# Patient Record
Sex: Female | Born: 2000
Health system: Southern US, Community
[De-identification: ages and names within clinical notes are randomized; demographics above are authoritative.]

## PROBLEM LIST (undated history)

## (undated) DIAGNOSIS — R569 Unspecified convulsions: Secondary | ICD-10-CM

---
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
## 2022-07-24 ENCOUNTER — Telehealth: Payer: Self-pay

## 2022-07-24 NOTE — Telephone Encounter (Signed)
Call to patient after message stating she requests an emergent visit due to not being able to tolerate seizure medication (depakote). Patient has not had an office since 01/2021 and states she has not seen anyone else and could not tolerate the seizure medication that was prescribed so she stopped taking it. She states she hasn't had a seizure. I advised patient I would send message to Dr. Teresa Coombs however we were closed on Friday and due to time of day, she may not get a response until Monday. Advised if she had a seizure or changes and needed immediate care to go to urgent care or emergency room. Patient verbalized understanding.

## 2022-07-25 NOTE — Telephone Encounter (Signed)
Another option is that she can be seen by NP if I do not have any availability

## 2022-07-28 NOTE — Progress Notes (Unsigned)
Patient: Linda Erickson Date of Birth: 10-Feb-2001  Reason for Visit: Follow up History from: Patient, mother on her phone  Primary Neurologist: [place]  ASSESSMENT AND PLAN 22 y.o. year old female   1.  Juvenile myoclonic epilepsy  2.  Migraine headaches  -Start Zonegran 100 mg at bedtime x 1 week then increase to 200 mg at bedtime for seizure prevention -Previously tried and failed: Keppra (anger), Depakote (nausea) -Next steps: May consider Briviact, or Lamictal recommended  -Try Maxalt 10 mg as needed for acute migraine headache -Call for intolerance to medication for continued seizure events -Follow-up in 6 months with Dr. Teresa Coombs or sooner if needed  Meds ordered this encounter  Medications   zonisamide (ZONEGRAN) 100 MG capsule    Sig: Take 1 capsule (100 mg total) by mouth at bedtime for 7 days, THEN 2 capsules (200 mg total) at bedtime thereafter    Dispense:  60 capsule    Refill:  11   rizatriptan (MAXALT-MLT) 10 MG disintegrating tablet    Sig: Take 1 tablet (10 mg total) by mouth as needed for migraine. May repeat in 2 hours if needed    Dispense:  9 tablet    Refill:  11    HISTORY OF PRESENT ILLNESS: Today 07/29/22 When last seen in November 2022 Keppra was discontinued due to side effects of irritability and anger.  Started on Depakote ER 1000 mg daily, when down to the 250 mg pill to help her swallow.  EEG was normal.  MRI of the brain with and without contrast was normal. Reports she stopped the Depakote due to nausea, feeling weak, took no more than 1 month. Has not been on any medication in over 1 year. No seizures. Still has the single jerks, could be her whole body, drops things. Happens everyday. Last April, her mother recalls, holding plate of food, went across the room, her whole body jerked, confused afterwards very briefly. 2 weeks ago had a headache, prompted her to call. Girlfriend reports in her sleep she jerks. Describes headaches, locations moves,  "it hurts", sometimes wants to vomit. Sensitive to light. Takes Advil, helps briefly. Bad headaches are once a month. Mother has migraines. She is working at [employer], loading a truck from ground level. She drives a car.   HISTORY  01/24/21 Dr. Teresa Coombs: This is a 22 year old woman with no past medical history who is presenting for evaluation after seizure.  She is accompanied today by girlfriend and mother.  Per patient, she was traveling with her girlfriend to go to the movies, she remembered talking to her girlfriend in the car and next thing she knows, she is in the hospital, she states she was a little bit confused initially and did not know why she was in the hospital. Per girlfriend, they were in the car talking, then patient made a grunting noise, flail both arms then started having convulsion, head deviated to the left and foaming and blood coming from her mouth, eyes rolled back, entire episode lasted about 1 minute.  She called 911 but EMS did not arrive therefore she just drove her car to the emergency room.  In the ED she had a head CT which did not show any acute abnormality, she was loaded with Keppra and started on 500 mg of Keppra twice daily.  Patient reported history of seizures as an infant, she was started on Depakote, per mother she used for one year then seizures subsided.  Her last seizure was when she  was 22 years old.  Denies any other seizure risk factors   REVIEW OF SYSTEMS: Out of a complete 14 system review of symptoms, the patient complains only of the following symptoms, and all other reviewed systems are negative.  See HPI  ALLERGIES: Allergies  Allergen Reactions   Depakote [Divalproex Sodium] Nausea Only    HOME MEDICATIONS: Outpatient Medications Prior to Visit  Medication Sig Dispense Refill   ibuprofen (ADVIL) 200 MG tablet Take 200 mg by mouth every 6 (six) hours as needed.     divalproex (DEPAKOTE ER) 250 MG 24 hr tablet Take 4 tablets (1,000 mg total) by  mouth daily. 120 tablet 3   No facility-administered medications prior to visit.    PAST MEDICAL HISTORY: Past Medical History:  Diagnosis Date   Seizures (HCC)     PAST SURGICAL HISTORY: History reviewed. No pertinent surgical history.  FAMILY HISTORY: History reviewed. No pertinent family history.  SOCIAL HISTORY: Social History   Socioeconomic History   Marital status: Single    Spouse name: Not on file   Number of children: Not on file   Years of education: Not on file   Highest education level: Not on file  Occupational History   Not on file  Tobacco Use   Smoking status: Never   Smokeless tobacco: Never  Vaping Use   Vaping Use: Never used  Substance and Sexual Activity   Alcohol use: Never   Drug use: Never   Sexual activity: Not Currently  Other Topics Concern   Not on file  Social History Narrative   Not on file   Social Determinants of Health   Financial Resource Strain: Not on file  Food Insecurity: Not on file  Transportation Needs: Not on file  Physical Activity: Not on file  Stress: Not on file  Social Connections: Not on file  Intimate Partner Violence: Not on file    PHYSICAL EXAM  Vitals:   07/29/22 0829  BP: 104/62  Pulse: (!) 132  Weight: 139 lb (63 kg)  Height: 5\' 5"  (1.651 m)   Body mass index is 23.13 kg/m.  Generalized: Well developed, in no acute distress  Neurological examination  Mentation: Alert oriented to time, place, history taking. Follows all commands speech and language fluent Cranial nerve II-XII: Pupils were equal round reactive to light. Extraocular movements were full, visual field were full on confrontational test. Facial sensation and strength were normal.  Head turning and shoulder shrug  were normal and symmetric. Motor: The motor testing reveals 5 over 5 strength of all 4 extremities. Good symmetric motor tone is noted throughout.  Sensory: Sensory testing is intact to soft touch on all 4 extremities. No  evidence of extinction is noted.  Coordination: Cerebellar testing reveals good finger-nose-finger and heel-to-shin bilaterally.  Gait and station: Gait is normal.  Reflexes: Deep tendon reflexes are symmetric and normal bilaterally.   DIAGNOSTIC DATA (LABS, IMAGING, TESTING) - I reviewed patient records, labs, notes, testing and imaging myself where available.  Lab Results  Component Value Date   WBC 6.4 01/22/2021   HGB 12.6 01/22/2021   HCT 37.0 01/22/2021   MCV 83.0 01/22/2021   PLT 251 01/22/2021      Component Value Date/Time   NA 138 01/22/2021 0029   K 3.4 (L) 01/22/2021 0029   CL 100 01/22/2021 0029   CO2 26 01/22/2021 0014   GLUCOSE 91 01/22/2021 0029   BUN 19 01/22/2021 0029   CREATININE 0.70 01/22/2021 0029  CALCIUM 9.0 01/22/2021 0014   PROT 7.2 01/22/2021 0014   ALBUMIN 4.2 01/22/2021 0014   AST 18 01/22/2021 0014   ALT 18 01/22/2021 0014   ALKPHOS 41 01/22/2021 0014   BILITOT 0.4 01/22/2021 0014   GFRNONAA >60 01/22/2021 0014   No results found for: "CHOL", "HDL", "LDLCALC", "LDLDIRECT", "TRIG", "CHOLHDL" No results found for: "HGBA1C" No results found for: "VITAMINB12" No results found for: "TSH"  Margie Ege, AGNP-C, DNP 07/29/2022, 8:53 AM [facility] [address], [address] [place], [place] [address] [phone]

## 2022-07-28 NOTE — Telephone Encounter (Signed)
Sounds good. Thanks 

## 2022-07-29 ENCOUNTER — Other Ambulatory Visit (HOSPITAL_COMMUNITY): Payer: Self-pay

## 2022-07-29 ENCOUNTER — Encounter: Payer: Self-pay | Admitting: Neurology

## 2022-07-29 ENCOUNTER — Ambulatory Visit: Payer: 59 | Admitting: Neurology

## 2022-07-29 VITALS — BP 104/62 | HR 132 | Ht 65.0 in | Wt 139.0 lb

## 2022-07-29 DIAGNOSIS — G40B09 Juvenile myoclonic epilepsy, not intractable, without status epilepticus: Secondary | ICD-10-CM | POA: Insufficient documentation

## 2022-07-29 MED ORDER — ZONISAMIDE 100 MG PO CAPS
ORAL_CAPSULE | ORAL | 11 refills | Status: AC
  Filled 2022-07-29: qty 60, 30d supply, fill #0
  Filled 2022-08-28: qty 60, 3d supply, fill #1

## 2022-07-29 MED ORDER — RIZATRIPTAN BENZOATE 10 MG PO TBDP
10.0000 mg | ORAL_TABLET | ORAL | 11 refills | Status: AC | PRN
  Filled 2022-07-29: qty 9, 30d supply, fill #0

## 2022-07-29 NOTE — Patient Instructions (Addendum)
Start taking Zonegran working up to 200 mg at bedtime  Try Maxalt as needed for acute headache Document spells, send my chart messages   Meds ordered this encounter  Medications   zonisamide (ZONEGRAN) 100 MG capsule    Sig: Take 1 capsule at bedtime x 1 week, then take 2 at bedtime    Dispense:  60 capsule    Refill:  11   rizatriptan (MAXALT-MLT) 10 MG disintegrating tablet    Sig: Take 1 tablet (10 mg total) by mouth as needed for migraine. May repeat in 2 hours if needed    Dispense:  9 tablet    Refill:  11

## 2023-02-03 ENCOUNTER — Encounter: Payer: Self-pay | Admitting: Neurology

## 2023-02-03 ENCOUNTER — Ambulatory Visit: Payer: 59 | Admitting: Neurology
